# Patient Record
Sex: Male | Born: 1960 | Race: White | Hispanic: No | Marital: Married | Smoking: Former smoker
Health system: Southern US, Community
[De-identification: ages and names within clinical notes are randomized; demographics above are authoritative.]

---

## 2005-10-02 ENCOUNTER — Ambulatory Visit: Payer: Self-pay | Admitting: Family Medicine

## 2007-02-19 ENCOUNTER — Ambulatory Visit: Payer: Self-pay | Admitting: Gastroenterology

## 2008-12-14 ENCOUNTER — Ambulatory Visit: Payer: Self-pay | Admitting: Family Medicine

## 2009-01-05 ENCOUNTER — Ambulatory Visit: Payer: Self-pay | Admitting: Family Medicine

## 2009-09-23 IMAGING — CT CT CHEST W/ CM
1 series · 15 of 31 positions shown, 19 images · non-contrast
Comparison: none

REASON FOR EXAM: density rt upper lobe  xray December 14, 2008
COMMENTS:

[Series 2: soft tissue · axial · 0.71mm/px · z∈[-397,-82]mm · 15 of 69 slices shown, 19 images]
[im 3/69  mediastinal]
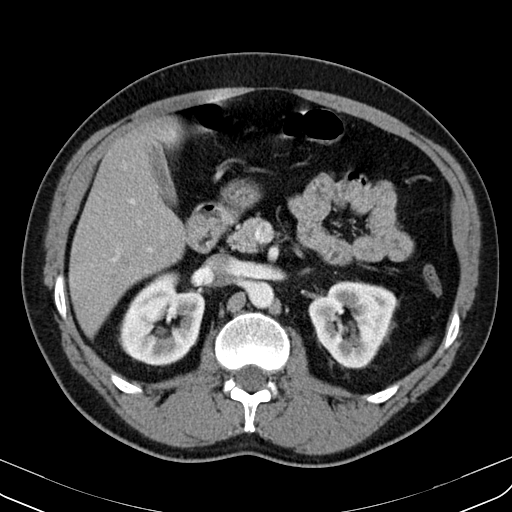
[im 3/69  lung]
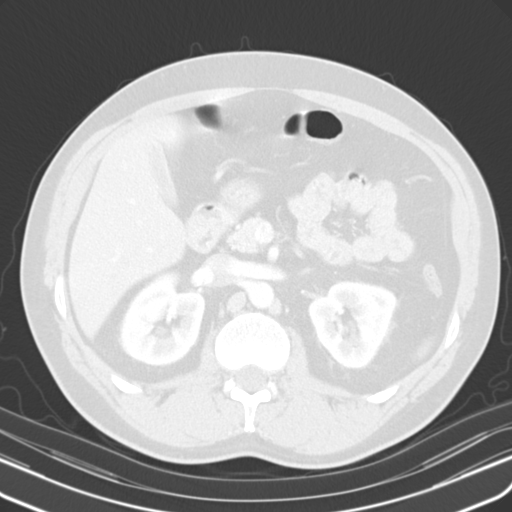
[im 8/69  lung]
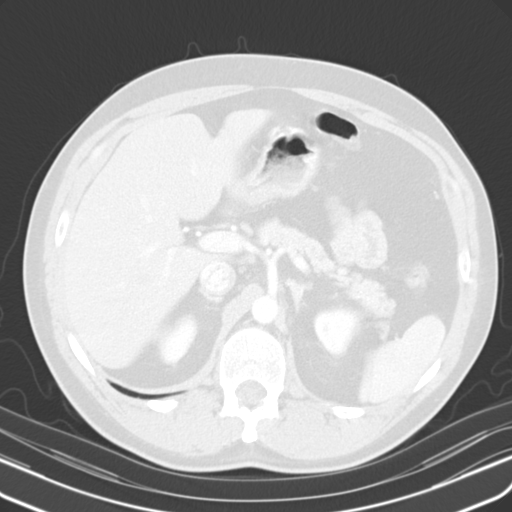
[im 13/69  lung]
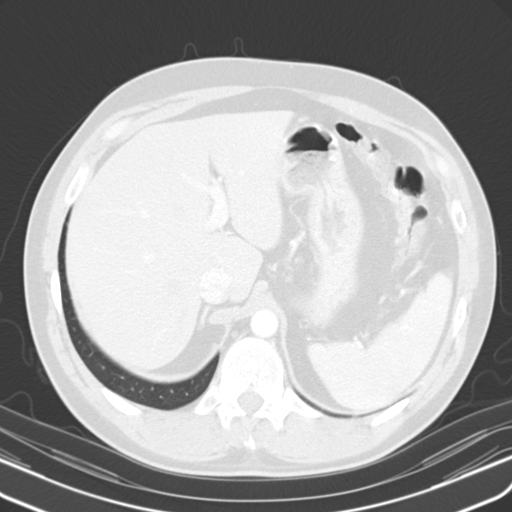
[im 16/69  lung]
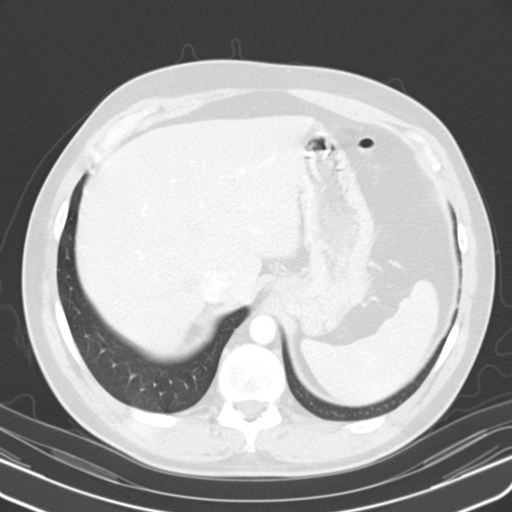
[im 21/69  mediastinal]
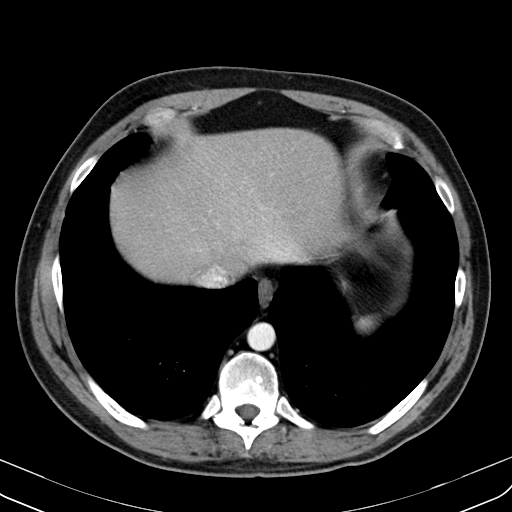
[im 21/69  lung]
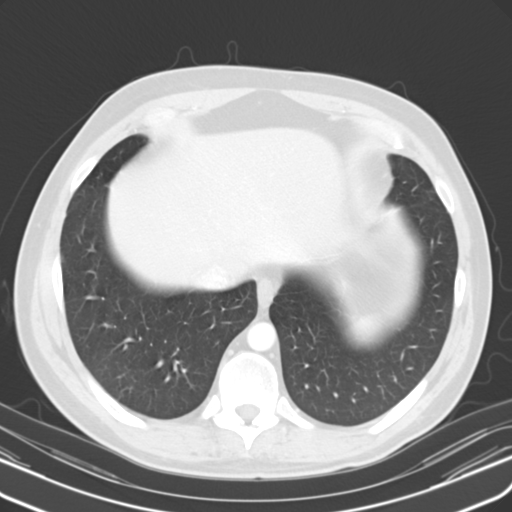
[im 26/69  lung]
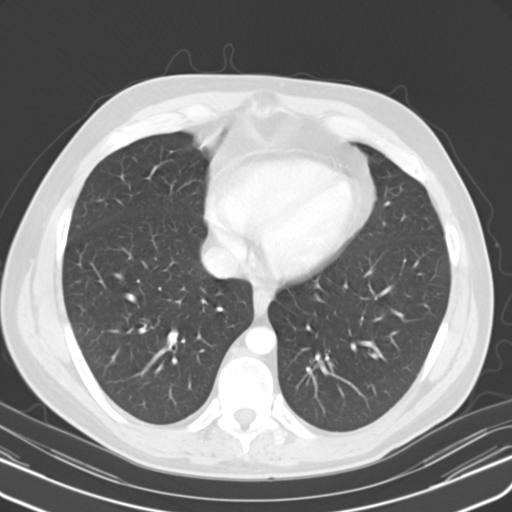
[im 31/69  lung]
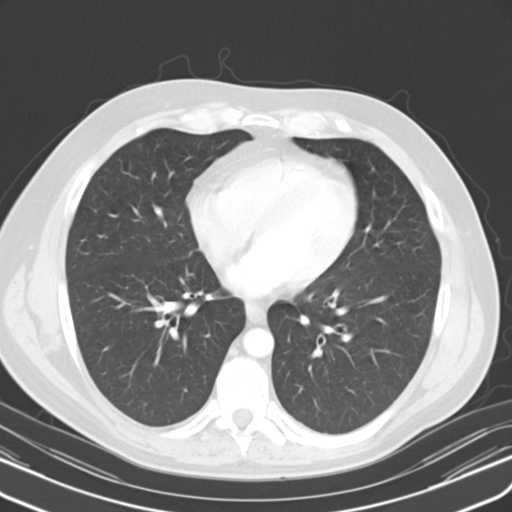
[im 36/69  lung]
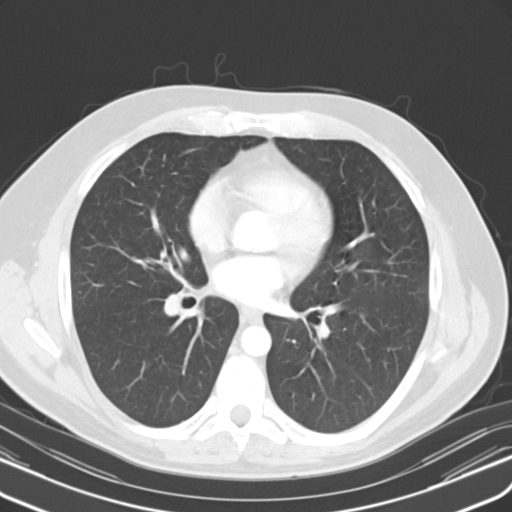
[im 38/69  mediastinal]
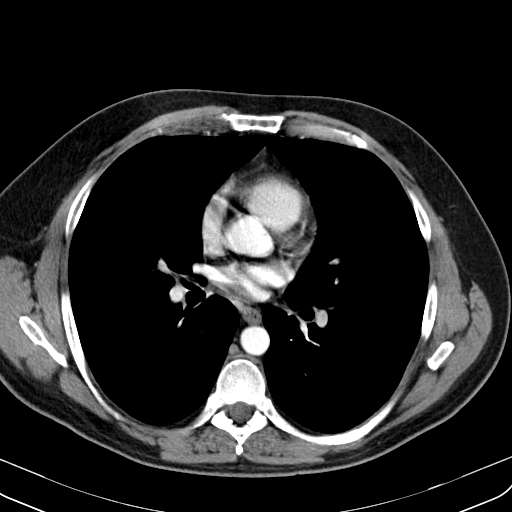
[im 38/69  lung]
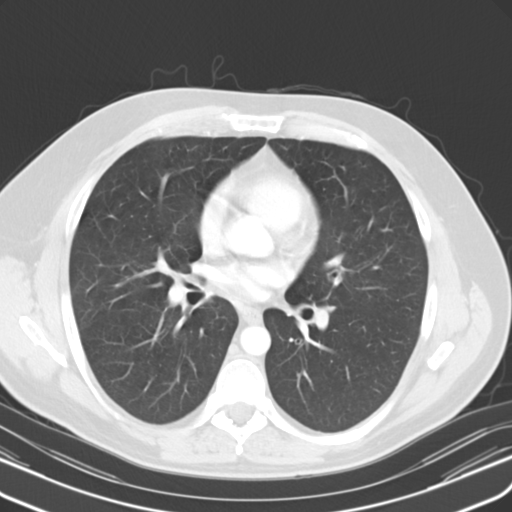
[im 43/69  lung]
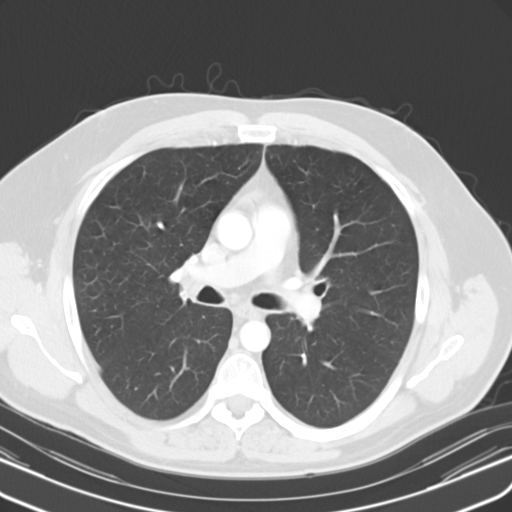
[im 48/69  lung]
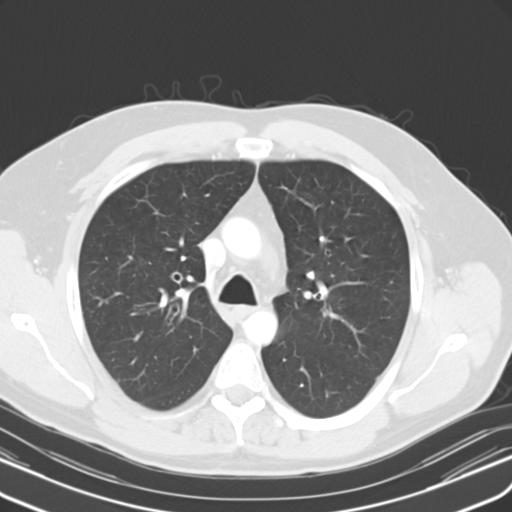
[im 53/69  lung]
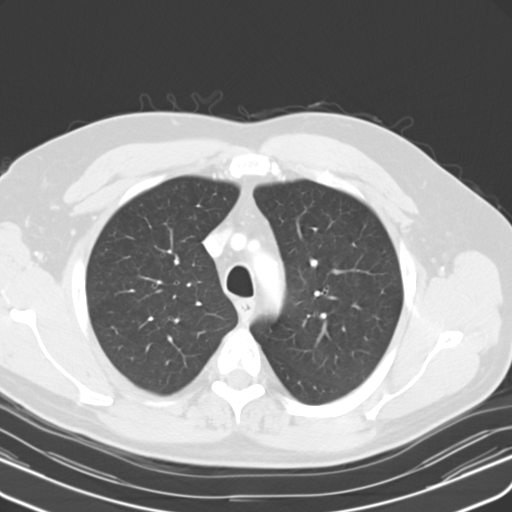
[im 56/69  mediastinal]
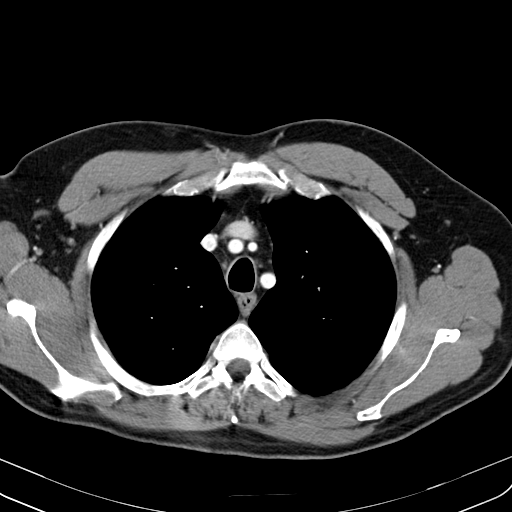
[im 56/69  lung]
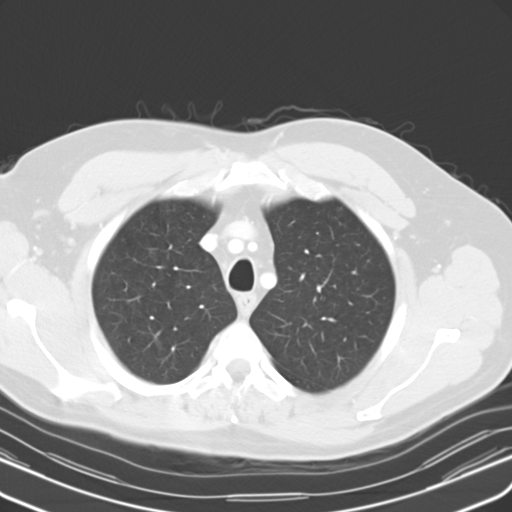
[im 61/69  lung]
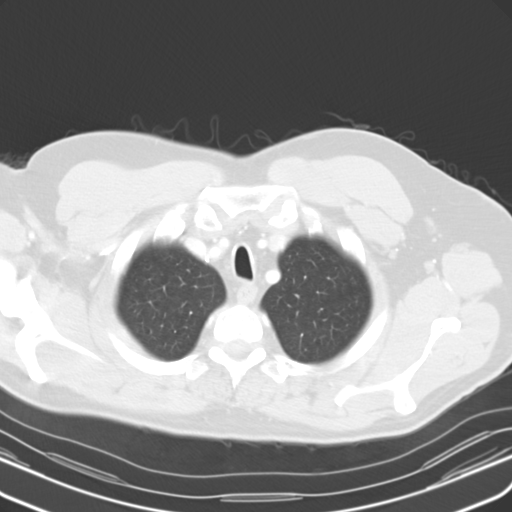
[im 66/69  lung]
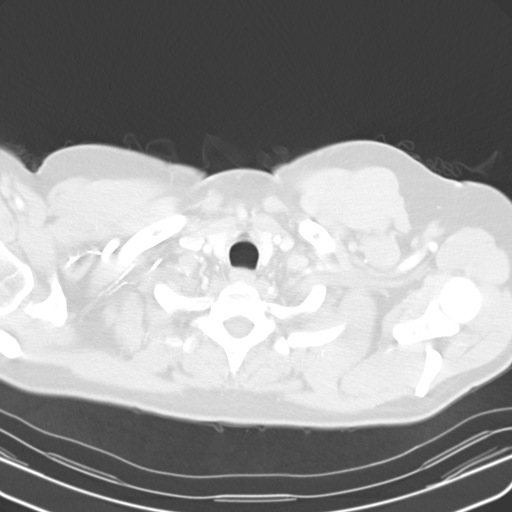

[15 of 31 positions shown; findings below may reference images not displayed]

PROCEDURE:     DITEDU - DITEDU CHEST WITH CONTRAST  - January 05, 2009  [DATE]

RESULT:     Axial CT scanning was performed through the chest at 5 mm
intervals and slice thicknesses following intravenous administration of 75
cc of [id number]. Review of 3-dimensional reconstructed images was performed
separately on the [employer] monitor. Comparison is made to the chest
x-ray dated 14 December, 2008.

At lung window settings I see no interstitial nor alveolar infiltrate.
However, there is a calcified nodule measuring 8 x 10 mm lying just deep to
the pleural surface in the lateral aspect of the right upper lobe on image
20. This is felt to correspond to the findings on the recent chest x-ray.
Elsewhere the right lung is clear. The left lung is clear. The cardiac
chambers are normal in size. The caliber of the thoracic aorta is normal.
There is no pleural nor pericardial effusion. There is no axillary
lymphadenopathy. Within the upper abdomen there are no adrenal masses. The
observed portions of the liver and spleen are normal. The gallbladder is
contracted. The thoracic vertebral bodies are preserved in height.
IMPRESSION: 1. There is a calcified nodule peripherally in the lateral aspect of the
right upper lobe. This corresponds to the chest x-ray findings. I see no
abnormal nodules elsewhere. There is calcified nodule likely reflects the
sequelae of prior granulomatous infection.
2. I see no mediastinal or hilar lymphadenopathy. There is a punctate
calcification just to the right of the anterior border of the carina on
image 23 which may reflect calcification from prior granulomatous infection
of the lymph node.
3. There is no evidence of CHF nor of pneumonia.

## 2010-10-21 ENCOUNTER — Ambulatory Visit: Payer: Self-pay | Admitting: Internal Medicine

## 2020-09-28 ENCOUNTER — Ambulatory Visit: Payer: BC Managed Care – PPO | Admitting: Podiatry

## 2020-09-28 ENCOUNTER — Ambulatory Visit (INDEPENDENT_AMBULATORY_CARE_PROVIDER_SITE_OTHER): Payer: BC Managed Care – PPO

## 2020-09-28 ENCOUNTER — Other Ambulatory Visit: Payer: Self-pay

## 2020-09-28 DIAGNOSIS — M722 Plantar fascial fibromatosis: Secondary | ICD-10-CM

## 2020-09-28 MED ORDER — MELOXICAM 15 MG PO TABS: 15.0000 mg | ORAL_TABLET | Freq: Every day | ORAL | 1 refills | Status: DC

## 2020-09-28 MED ORDER — METHYLPREDNISOLONE 4 MG PO TBPK
ORAL_TABLET | ORAL | 0 refills | Status: AC
Start: 2020-09-28 — End: ?

## 2020-09-28 MED ORDER — BETAMETHASONE SOD PHOS & ACET 6 (3-3) MG/ML IJ SUSP
3.0000 mg | Freq: Once | INTRAMUSCULAR | Status: AC
  Administered 2020-09-28: 3 mg via INTRA_ARTICULAR

## 2020-09-28 NOTE — Progress Notes (Signed)
   Subjective: 60 y.o. male presenting as a new patient for evaluation of right heel pain is been going on for approximately 6 months now.  Patient denies a history of injury.  He states that he began hiking outdoors and over time he noticed increased pain to his right heel.  He has tried some over-the-counter insoles which helped modestly with the pain.  He presents for further treatment evaluation   No past medical history on file.   Objective: Physical Exam General: The patient is alert and oriented x3 in no acute distress.  Dermatology: Skin is warm, dry and supple bilateral lower extremities. Negative for open lesions or macerations bilateral.   Vascular: Dorsalis Pedis and Posterior Tibial pulses palpable bilateral.  Capillary fill time is immediate to all digits.  Neurological: Epicritic and protective threshold intact bilateral.   Musculoskeletal: Tenderness to palpation to the plantar aspect of the right heel along the plantar fascia. All other joints range of motion within normal limits bilateral. Strength 5/5 in all groups bilateral.   Radiographic exam: Normal osseous mineralization. Joint spaces preserved. No fracture/dislocation/boney destruction. No other soft tissue abnormalities or radiopaque foreign bodies.   Assessment: 1. Plantar fasciitis right  Plan of Care:  1. Patient evaluated. Xrays reviewed.   2. Injection of 0.5cc Celestone soluspan injected into the right plantar fascia  3. Rx for Medrol Dose Pack placed 4. Rx for Meloxicam ordered for patient. 5.  Continue OTC copper fit insoles and Keen hiking boots 6. Instructed patient regarding therapies and modalities at home to alleviate symptoms.  7. Return to clinic in 4 weeks.    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 Felecia Shelling, DPM    [address] [place], [place] [address]                Office [phone]  Fax [phone]

## 2020-10-26 ENCOUNTER — Other Ambulatory Visit: Payer: Self-pay

## 2020-10-26 ENCOUNTER — Ambulatory Visit: Payer: BC Managed Care – PPO | Admitting: Podiatry

## 2020-10-26 DIAGNOSIS — M722 Plantar fascial fibromatosis: Secondary | ICD-10-CM | POA: Diagnosis not present

## 2020-10-26 MED ORDER — BETAMETHASONE SOD PHOS & ACET 6 (3-3) MG/ML IJ SUSP
3.0000 mg | Freq: Once | INTRAMUSCULAR | Status: AC
  Administered 2020-10-26: 3 mg via INTRA_ARTICULAR

## 2020-10-26 NOTE — Progress Notes (Signed)
   Subjective: 60 y.o. male presenting for follow-up evaluation of right heel pain is been going on for approximately 6 months now.  He states that he began hiking outdoors and over time he noticed increased pain to his right heel.  He has tried some over-the-counter insoles which helped modestly with the pain.   Patient last seen in the office on 09/28/2020 where he received an injection and was treated with oral anti-inflammatories.  He states that he felt much better after the injection and the prednisone pack.  He continues to take meloxicam as needed.   No past medical history on file.   Objective: Physical Exam General: The patient is alert and oriented x3 in no acute distress.  Dermatology: Skin is warm, dry and supple bilateral lower extremities. Negative for open lesions or macerations bilateral.   Vascular: Dorsalis Pedis and Posterior Tibial pulses palpable bilateral.  Capillary fill time is immediate to all digits.  Neurological: Epicritic and protective threshold intact bilateral.   Musculoskeletal: Tenderness to palpation to the plantar aspect of the right heel along the plantar fascia. All other joints range of motion within normal limits bilateral. Strength 5/5 in all groups bilateral.   Assessment: 1. Plantar fasciitis right  Plan of Care:  1. Patient evaluated. 2. Injection of 0.5cc Celestone soluspan injected into the right plantar fascia  3.  OTC power step insoles provided today  4.  Continue meloxicam 15 mg daily as needed 5.  Instructed patient regarding therapies and modalities at home to alleviate symptoms.  6. Return to clinic in 8 weeks  *Goes by Ed.  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 Felecia Shelling, DPM    [address] [place], [place] [address]                Office [phone]  Fax [phone]

## 2020-12-28 ENCOUNTER — Ambulatory Visit: Payer: BC Managed Care – PPO | Admitting: Podiatry

## 2022-06-27 ENCOUNTER — Encounter: Payer: Self-pay | Admitting: Podiatry

## 2022-06-27 ENCOUNTER — Ambulatory Visit: Payer: BC Managed Care – PPO | Admitting: Podiatry

## 2022-06-27 VITALS — BP 121/78 | HR 86

## 2022-06-27 DIAGNOSIS — M722 Plantar fascial fibromatosis: Secondary | ICD-10-CM

## 2022-06-27 DIAGNOSIS — R52 Pain, unspecified: Secondary | ICD-10-CM

## 2022-06-27 MED ORDER — BETAMETHASONE SOD PHOS & ACET 6 (3-3) MG/ML IJ SUSP
3.0000 mg | Freq: Once | INTRAMUSCULAR | Status: AC
  Administered 2022-06-27: 3 mg via INTRA_ARTICULAR

## 2022-06-27 MED ORDER — MELOXICAM 15 MG PO TABS: 15.0000 mg | ORAL_TABLET | Freq: Every day | ORAL | 1 refills | Status: DC

## 2022-06-27 NOTE — Progress Notes (Signed)
   Chief Complaint  Patient presents with   Foot Pain    "My Plantar has been acting up."    Subjective: 61 y.o. male presenting for a new complaint of pain and tenderness associated to the left plantar heel.  Patient is concerned for plantar fasciitis.  He does have a history of plantar fasciitis to the right foot.  Denies a history of injury.  He has not done anything currently for treatment.   No past medical history on file.   Objective: Physical Exam General: The patient is alert and oriented x3 in no acute distress.  Dermatology: Skin is warm, dry and supple bilateral lower extremities. Negative for open lesions or macerations bilateral.   Vascular: Dorsalis Pedis and Posterior Tibial pulses palpable bilateral.  Capillary fill time is immediate to all digits.  Neurological: Epicritic and protective threshold intact bilateral.   Musculoskeletal: Tenderness to palpation to the plantar aspect of the left heel along the plantar fascia. All other joints range of motion within normal limits bilateral. Strength 5/5 in all groups bilateral.    Assessment: 1. Plantar fasciitis left foot  Plan of Care:  1. Patient evaluated. Xrays reviewed.   2. Injection of 0.5cc Celestone soluspan injected into the left plantar fascia.  3. Rx for Meloxicam ordered for patient. 4.  Continue wearing [employer] running shoes 5.  Return to clinic as needed   *Goes by Ed.  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 Felecia Shelling, DPM    [address] [place], [place] [address]                Office [phone]  Fax [phone]

## 2022-09-25 ENCOUNTER — Other Ambulatory Visit: Payer: Self-pay | Admitting: Podiatry

## 2022-10-27 ENCOUNTER — Ambulatory Visit: Payer: BC Managed Care – PPO | Admitting: Podiatry

## 2022-10-27 DIAGNOSIS — M722 Plantar fascial fibromatosis: Secondary | ICD-10-CM

## 2022-10-27 NOTE — Progress Notes (Signed)
   Chief Complaint  Patient presents with   Plantar Fasciitis    Left foot plantar fasciitis heel pain follow-up,  rate of pain 6 out of 10, patient would like an injection today TX: mobic     Subjective: 62 y.o. male presenting for follow-up evaluation of pain and tenderness associated to the left plantar heel.  Patient continues to have pain and tenderness.  Last seen in the office 06/27/2022.  He says injections do help temporarily.  He has also been taking the meloxicam as prescribed.  No past medical history on file.  No Known Allergies   Objective: Physical Exam General: The patient is alert and oriented x3 in no acute distress.  Dermatology: Skin is warm, dry and supple bilateral lower extremities. Negative for open lesions or macerations bilateral.   Vascular: Dorsalis Pedis and Posterior Tibial pulses palpable bilateral.  Capillary fill time is immediate to all digits.  Neurological: Grossly intact via light touch  Musculoskeletal: There continues to be tenderness to palpation to the plantar aspect of the left heel along the plantar fascia. All other joints range of motion within normal limits bilateral. Strength 5/5 in all groups bilateral.    Assessment: 1. Plantar fasciitis left foot  -Patient evaluated -Injection of 0.5 cc Celestone Soluspan injected along the left plantar fascia -Continue meloxicam 15 mg daily as prescribed -Continue wearing good supportive [place] running shoes -Return to clinic as needed   *Goes by [employer].  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 Felecia Shelling, DPM    [address] [place], [place] [address]                Office [phone]  Fax [phone]

## 2022-11-06 DIAGNOSIS — M722 Plantar fascial fibromatosis: Secondary | ICD-10-CM | POA: Diagnosis not present

## 2022-11-06 MED ORDER — BETAMETHASONE SOD PHOS & ACET 6 (3-3) MG/ML IJ SUSP
3.0000 mg | Freq: Once | INTRAMUSCULAR | Status: AC
  Administered 2022-11-06: 3 mg via INTRA_ARTICULAR

## 2022-12-08 ENCOUNTER — Ambulatory Visit: Payer: BC Managed Care – PPO | Admitting: Podiatry

## 2022-12-08 DIAGNOSIS — M722 Plantar fascial fibromatosis: Secondary | ICD-10-CM | POA: Diagnosis not present

## 2022-12-08 MED ORDER — MELOXICAM 15 MG PO TABS: 15.0000 mg | ORAL_TABLET | Freq: Every day | ORAL | 2 refills | Status: AC

## 2022-12-08 MED ORDER — BETAMETHASONE SOD PHOS & ACET 6 (3-3) MG/ML IJ SUSP
3.0000 mg | Freq: Once | INTRAMUSCULAR | Status: AC
Start: 2022-12-08 — End: 2022-12-08
  Administered 2022-12-08: 3 mg via INTRA_ARTICULAR

## 2022-12-08 NOTE — Progress Notes (Signed)
   Chief Complaint  Patient presents with   Plantar Fasciitis    Left foot plantar fasciitis heel pain follow-up,  rate of pain 4 out of 10, patient states he is doing the same as last visit, injection has helped, TX: mobic        Subjective: 62 y.o. male presenting for follow-up evaluation of pain and tenderness associated to the left plantar heel.  Patient continues to have pain and tenderness.  Patient continues wearing good supportive tennis shoes and sneakers.  He says injections do help temporarily as well as the meloxicam.  No past medical history on file.  No Known Allergies   Objective: Physical Exam General: The patient is alert and oriented x3 in no acute distress.  Dermatology: Skin is warm, dry and supple bilateral lower extremities. Negative for open lesions or macerations bilateral.   Vascular: Dorsalis Pedis and Posterior Tibial pulses palpable bilateral.  Capillary fill time is immediate to all digits.  Neurological: Grossly intact via light touch  Musculoskeletal: There continues to be tenderness to palpation to the plantar aspect of the left heel along the plantar fascia. All other joints range of motion within normal limits bilateral. Strength 5/5 in all groups bilateral.    Assessment: 1. Plantar fasciitis left foot  -Patient evaluated -Injection of 0.5 cc Celestone Soluspan injected along the left plantar fascia -Continue meloxicam 15 mg daily as prescribed.  Refill prescribed -Continue wearing good supportive [place] running shoes - Appointment for custom molded orthotics with our orthotics department.  Order placed -We also discussed different treatment modalities today including physical therapy, shockwave, PRP injections, and ultimately surgery.  Currently the patient states that he does not believe that he needs to pursue any of these additional options.  Efficacies and advantages and disadvantages of each of these modalities were explained. -Return to  clinic about 2 months after wearing his custom orthotics   *Goes by Ed.  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 Felecia Shelling, DPM    [address] [place], [place] [address]                Office [phone]  Fax [phone]

## 2022-12-29 ENCOUNTER — Ambulatory Visit (INDEPENDENT_AMBULATORY_CARE_PROVIDER_SITE_OTHER): Payer: BC Managed Care – PPO | Admitting: Podiatry

## 2022-12-29 DIAGNOSIS — M722 Plantar fascial fibromatosis: Secondary | ICD-10-CM | POA: Diagnosis not present

## 2022-12-29 NOTE — Progress Notes (Signed)
Patient presents today to measured for custom orthotics.  Patient was dispensed 1 pair of custom orthotics   Ht 5'11 Wt 240 Shoe size  10.5 regular  Re-appointment for regularly scheduled diabetic foot care visits or if they should experience any trouble with the shoes or insoles.   Patient may be interested in multiple pairs of orthotics , he will check with insurance on how many they will cover and let us know about ordering the 2nd set.

## 2023-01-09 ENCOUNTER — Telehealth: Payer: Self-pay | Admitting: Podiatry

## 2023-01-09 NOTE — Telephone Encounter (Signed)
Lmom to call to schedule picking up orthotics   Balance pending insurance

## 2023-02-09 ENCOUNTER — Ambulatory Visit (INDEPENDENT_AMBULATORY_CARE_PROVIDER_SITE_OTHER): Payer: BC Managed Care – PPO | Admitting: Podiatry

## 2023-02-09 DIAGNOSIS — M722 Plantar fascial fibromatosis: Secondary | ICD-10-CM

## 2023-02-09 DIAGNOSIS — R52 Pain, unspecified: Secondary | ICD-10-CM

## 2023-02-09 NOTE — Progress Notes (Signed)
Patient presents today to pick up custom orthotics   Patient was dispensed 1 pair of custom orthotics  Fit was satisfactory. Instructions for break-in and wear was reviewed and a copy was given to the patient.    

## 2024-04-16 ENCOUNTER — Ambulatory Visit: Payer: Self-pay | Admitting: Physical Therapy

## 2024-04-21 ENCOUNTER — Encounter: Payer: Self-pay | Admitting: Physical Therapy

## 2024-04-23 ENCOUNTER — Encounter: Payer: Self-pay | Admitting: Physical Therapy

## 2024-04-28 ENCOUNTER — Encounter: Payer: Self-pay | Admitting: Physical Therapy

## 2024-04-30 ENCOUNTER — Encounter: Payer: Self-pay | Admitting: Physical Therapy
